# Patient Record
Sex: Male | Born: 1985 | Race: Black or African American | Hispanic: No | Marital: Single | Smoking: Smoker, current status unknown
Health system: Southern US, Community
[De-identification: ages and names within clinical notes are randomized; demographics above are authoritative.]

## PROBLEM LIST (undated history)

## (undated) DIAGNOSIS — M1990 Unspecified osteoarthritis, unspecified site: Secondary | ICD-10-CM

---

## 2014-07-26 ENCOUNTER — Encounter (HOSPITAL_COMMUNITY): Payer: Self-pay | Admitting: Emergency Medicine

## 2014-07-26 ENCOUNTER — Emergency Department (HOSPITAL_COMMUNITY): Payer: Self-pay

## 2014-07-26 ENCOUNTER — Emergency Department (HOSPITAL_COMMUNITY)
Admission: EM | Admit: 2014-07-26 | Discharge: 2014-07-26 | Disposition: A | Discharge status: Home / Self Care | Payer: Self-pay | Attending: Emergency Medicine | Admitting: Emergency Medicine

## 2014-07-26 DIAGNOSIS — Y9389 Activity, other specified: Secondary | ICD-10-CM | POA: Insufficient documentation

## 2014-07-26 DIAGNOSIS — S51011A Laceration without foreign body of right elbow, initial encounter: Secondary | ICD-10-CM

## 2014-07-26 DIAGNOSIS — F17200 Nicotine dependence, unspecified, uncomplicated: Secondary | ICD-10-CM | POA: Insufficient documentation

## 2014-07-26 DIAGNOSIS — Y9289 Other specified places as the place of occurrence of the external cause: Secondary | ICD-10-CM | POA: Insufficient documentation

## 2014-07-26 DIAGNOSIS — Z8739 Personal history of other diseases of the musculoskeletal system and connective tissue: Secondary | ICD-10-CM | POA: Insufficient documentation

## 2014-07-26 DIAGNOSIS — S51009A Unspecified open wound of unspecified elbow, initial encounter: Secondary | ICD-10-CM | POA: Insufficient documentation

## 2014-07-26 DIAGNOSIS — Z79899 Other long term (current) drug therapy: Secondary | ICD-10-CM | POA: Insufficient documentation

## 2014-07-26 DIAGNOSIS — Z23 Encounter for immunization: Secondary | ICD-10-CM | POA: Insufficient documentation

## 2014-07-26 DIAGNOSIS — W268XXA Contact with other sharp object(s), not elsewhere classified, initial encounter: Secondary | ICD-10-CM | POA: Insufficient documentation

## 2014-07-26 HISTORY — DX: Unspecified osteoarthritis, unspecified site: M19.90

## 2014-07-26 MED ORDER — TETANUS-DIPHTH-ACELL PERTUSSIS 5-2.5-18.5 LF-MCG/0.5 IM SUSP
0.5000 mL | Freq: Once | INTRAMUSCULAR | Status: AC
  Administered 2014-07-26: 0.5 mL via INTRAMUSCULAR
  Filled 2014-07-26: qty 0.5

## 2014-07-26 MED ORDER — LIDOCAINE-EPINEPHRINE (PF) 1 %-1:200000 IJ SOLN
20.0000 mL | Freq: Once | INTRAMUSCULAR | Status: AC
  Administered 2014-07-26: 20 mL
  Filled 2014-07-26: qty 10
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
## 2014-07-26 NOTE — Discharge Instructions (Signed)
You were seen today and had a DELAYED wound repair.  You should monitor for signs and symptoms of infection.  Return in 7 days for staple removal.  Staple Care and Removal Your caregiver has used staples today to repair your wound. Staples are used to help a wound heal faster by holding the edges of the wound together. The staples can be removed when the wound has healed well enough to stay together after the staples are removed. A dressing (wound covering), depending on the location of the wound, may have been applied. This may be changed once per day or as instructed. If the dressing sticks, it may be soaked off with soapy water or hydrogen peroxide. Only take over-the-counter or prescription medicines for pain, discomfort, or fever as directed by your caregiver.  If you did not receive a tetanus shot today because you did not recall when your last one was given, check with your caregiver when you have your staples removed to determine if one is needed. Return to your caregiver's office in 1 week or as suggested to have your staples removed. SEEK IMMEDIATE MEDICAL CARE IF:   You have redness, swelling, or increasing pain in the wound.  You have pus coming from the wound.  You have a fever.  You notice a bad smell coming from the wound or dressing.  Your wound edges break open after staples have been removed. Document Released: 07/16/2001 Document Revised: 01/13/2012 Document Reviewed: 07/31/2005 [facility] Patient Information 2015 [place], [place]. This information is not intended to replace advice given to you by your health care provider. Make sure you discuss any questions you have with your health care provider.
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
## 2014-07-26 NOTE — ED Notes (Signed)
Pt right elbow stapled by EDP, covered with 4x4 dressing and tape. Pt advised to come back in 7 days to have staples removed per instructions. Verbalized understanding.

## 2015-06-28 IMAGING — CR DG ELBOW COMPLETE 3+V*R*
2 series · 2 of 2 positions shown · non-contrast
Comparison: None.

CLINICAL DATA: Laceration

EXAM:
RIGHT ELBOW - COMPLETE 3+ VIEW

[view not recorded (1 of 2)]
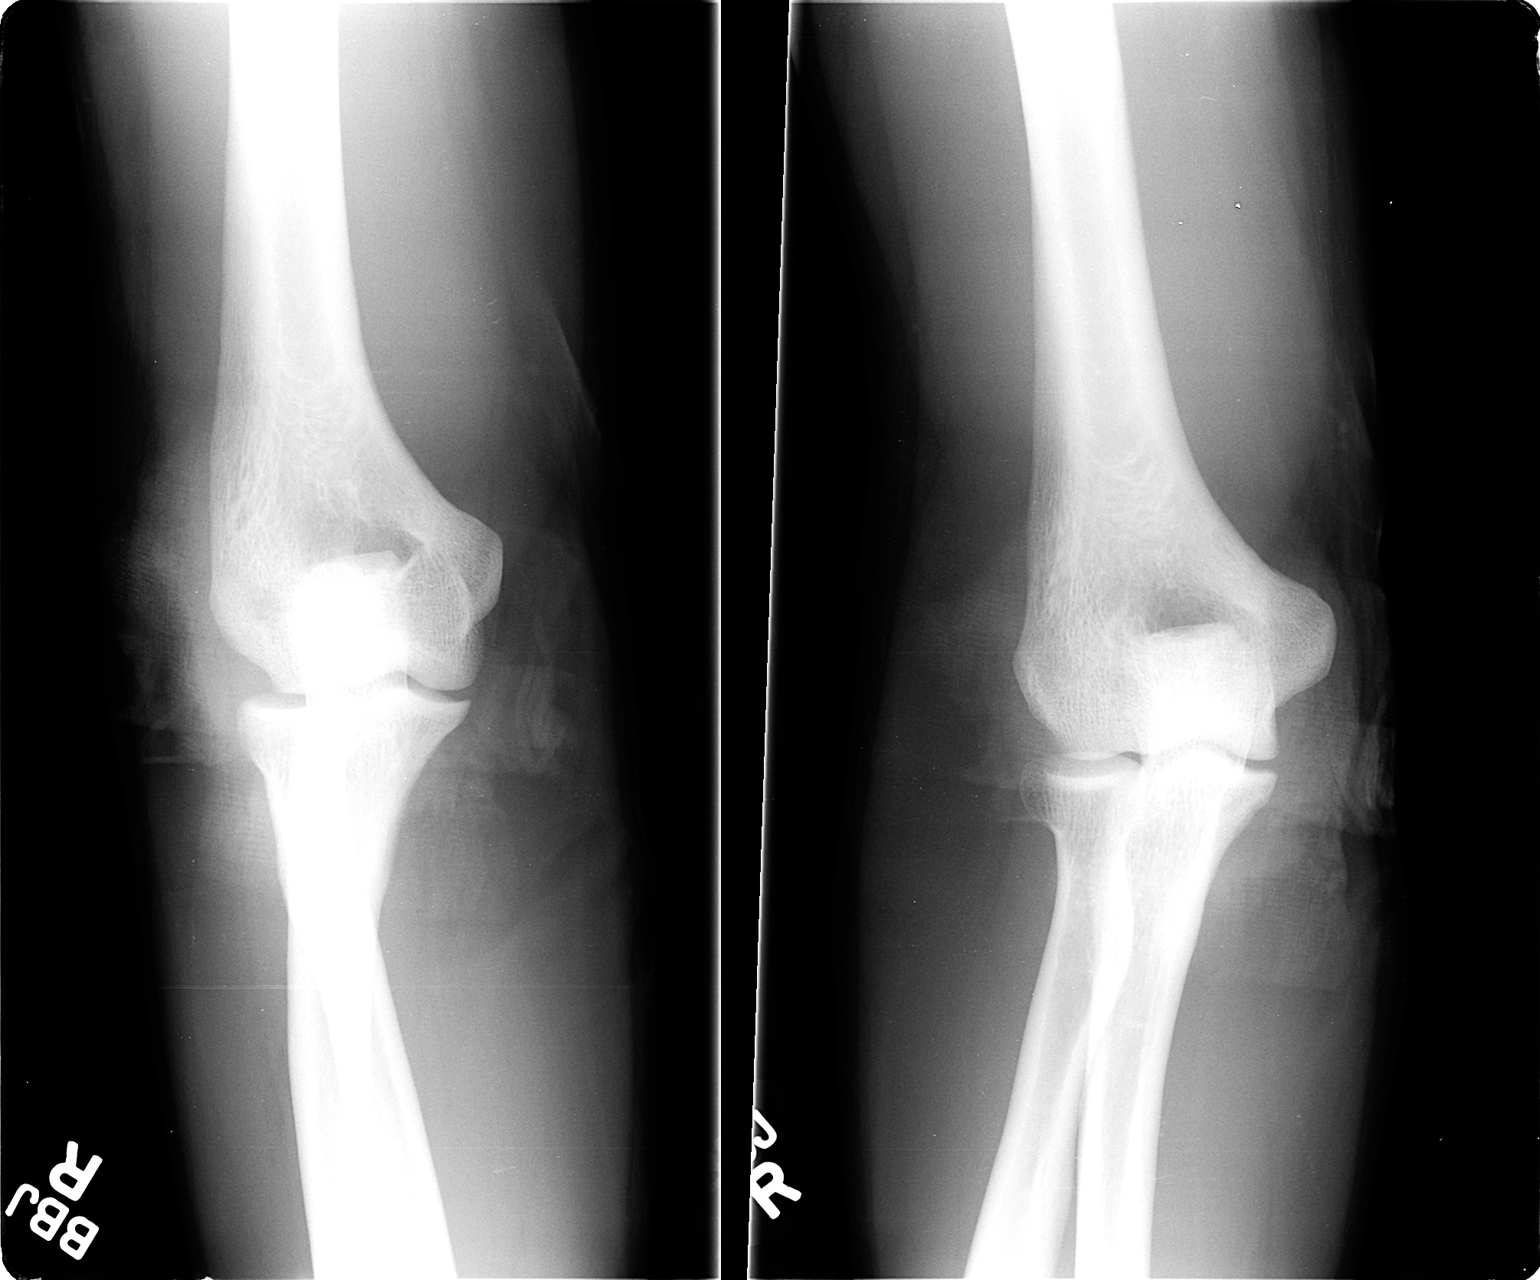

[view not recorded (2 of 2)]
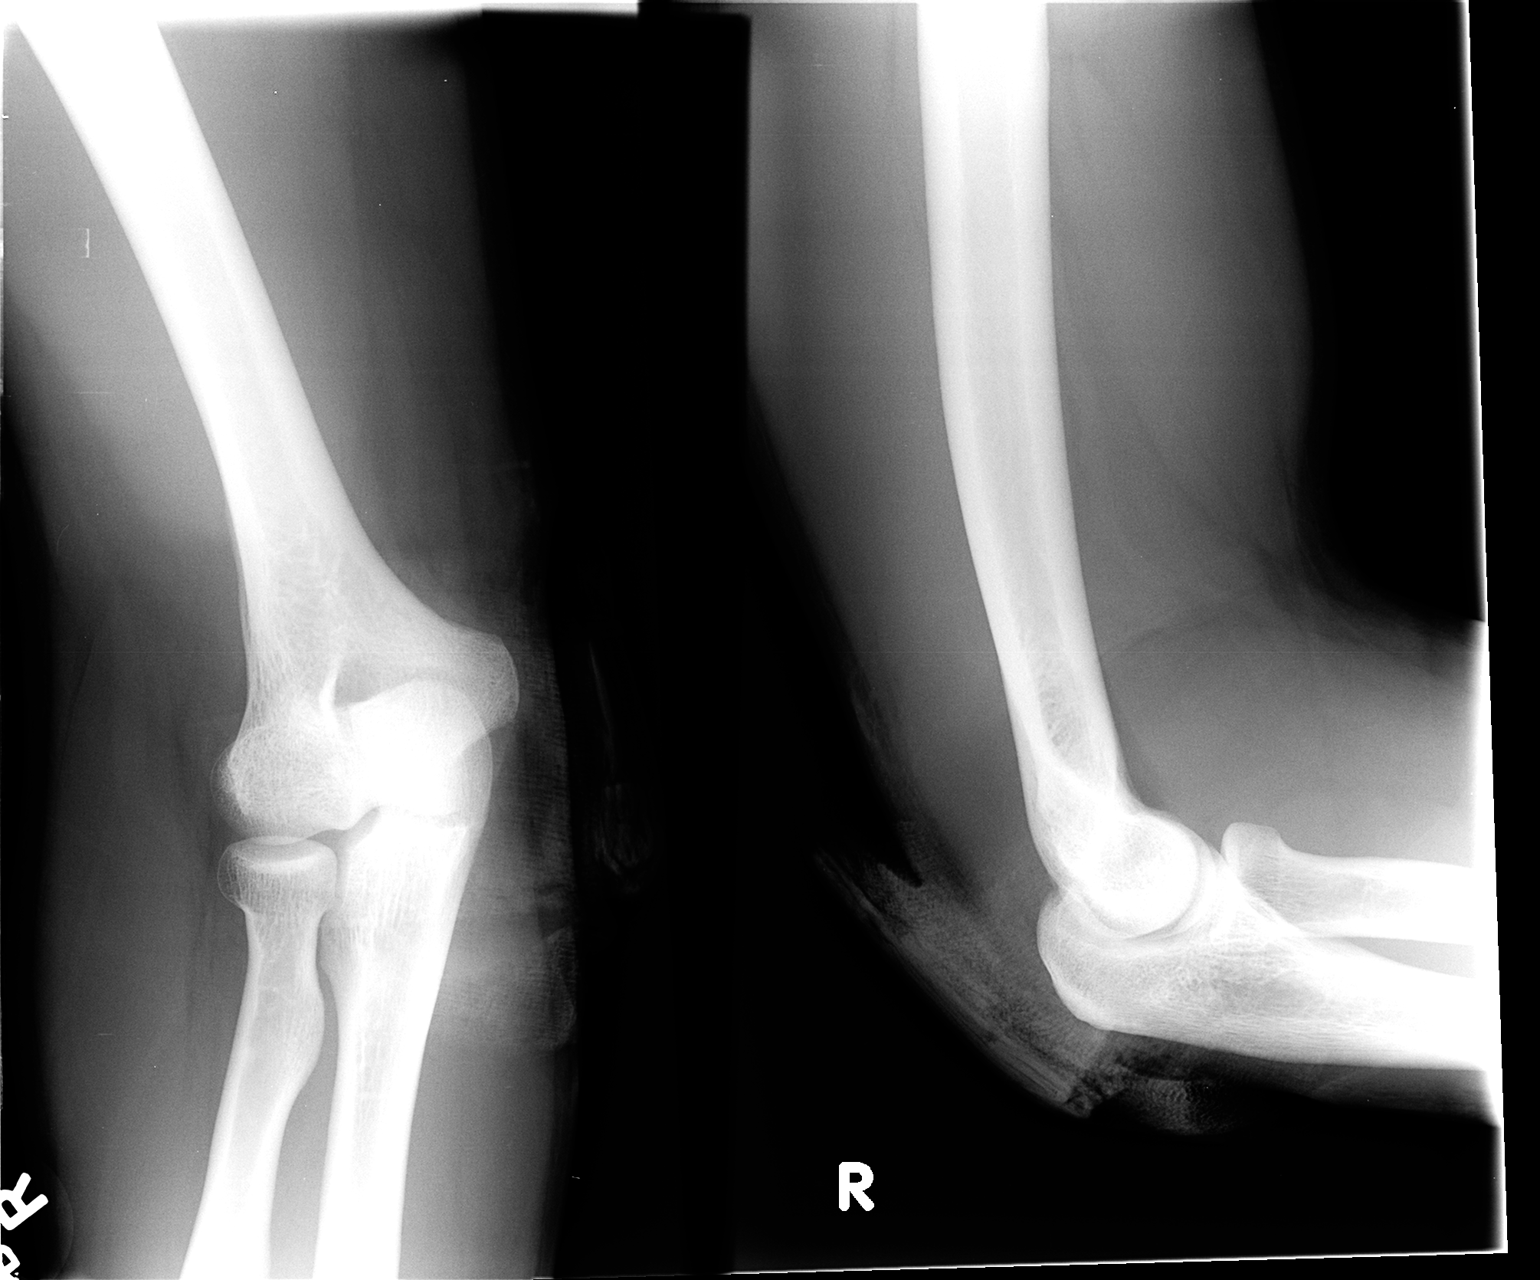

[2 of 2 positions shown; findings below may reference images not displayed]

FINDINGS: Laceration present at the posterior elbow. No radiopaque foreign
body identified.

No acute fracture or dislocation. No joint effusion. Radial head is
intact. No osseous abnormality.
IMPRESSION: 1. Laceration at the posterior elbow.  No retained foreign body.
2. No acute fracture or dislocation.
